# Patient Record
Sex: Male | Born: 1960 | Race: Black or African American | Hispanic: No | Marital: Single | Smoking: Never smoker
Health system: Southern US, Community
[De-identification: ages and names within clinical notes are randomized; demographics above are authoritative.]

## PROBLEM LIST (undated history)

## (undated) DIAGNOSIS — E119 Type 2 diabetes mellitus without complications: Secondary | ICD-10-CM

## (undated) DIAGNOSIS — E785 Hyperlipidemia, unspecified: Secondary | ICD-10-CM

## (undated) DIAGNOSIS — I10 Essential (primary) hypertension: Secondary | ICD-10-CM

## (undated) DIAGNOSIS — G4730 Sleep apnea, unspecified: Secondary | ICD-10-CM

## (undated) HISTORY — PX: MANDIBLE FRACTURE SURGERY: SHX706

---

## 1981-06-18 HISTORY — PX: MANDIBLE FRACTURE SURGERY: SHX706

## 2011-07-24 ENCOUNTER — Ambulatory Visit: Payer: Self-pay | Admitting: Family Medicine

## 2012-05-04 IMAGING — US US RENAL KIDNEY
1 series · 17 of 25 positions shown · non-contrast
Comparison: none

REASON FOR EXAM: hypertension
COMMENTS:

[Series 1: us renal kidney · 17 of 40 slices shown]
[im 1/40]
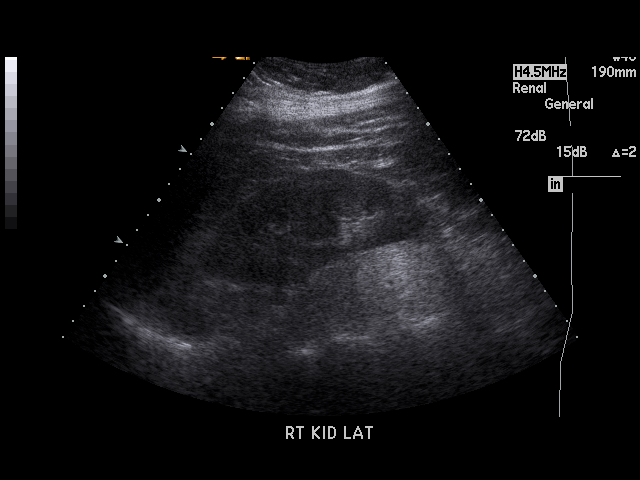
[im 4/40]
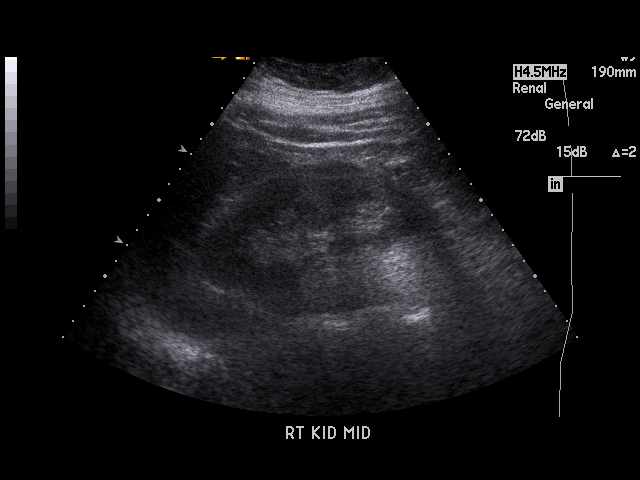
[im 5/40]
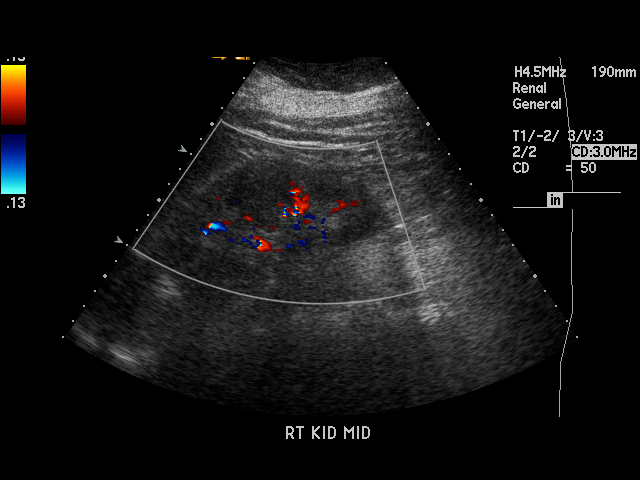
[im 9/40]
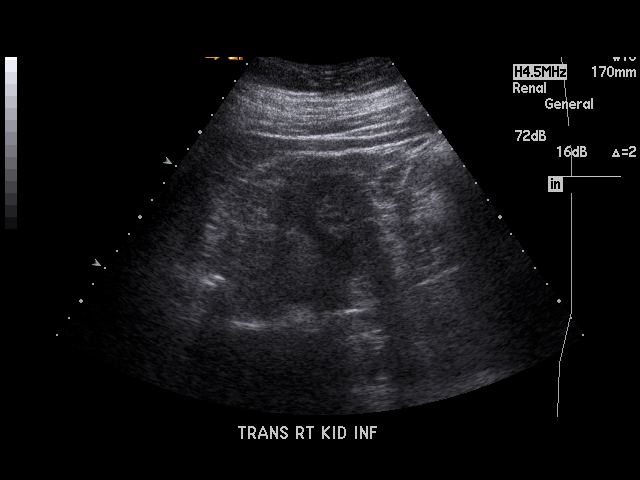
[im 10/40]
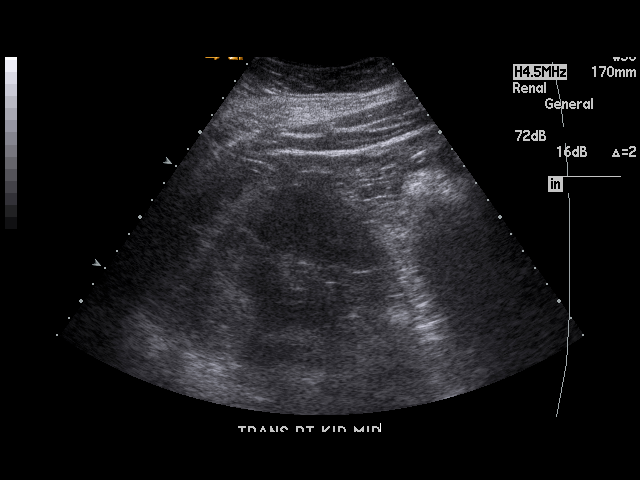
[im 14/40]
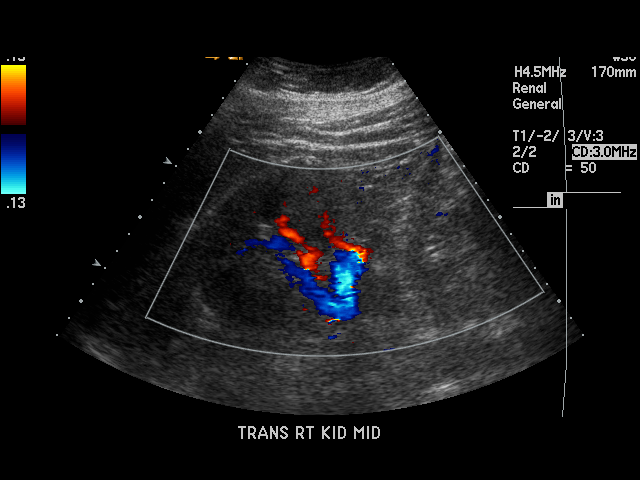
[im 15/40]
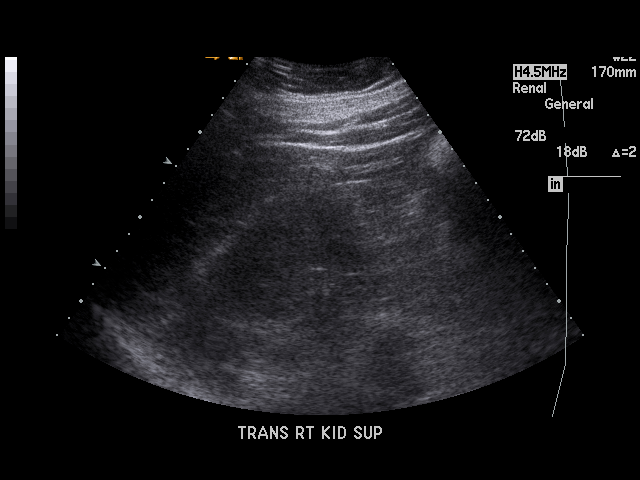
[im 18/40]
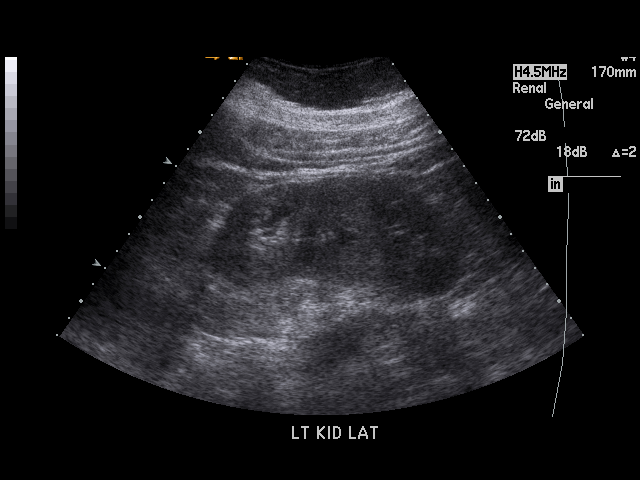
[im 20/40]
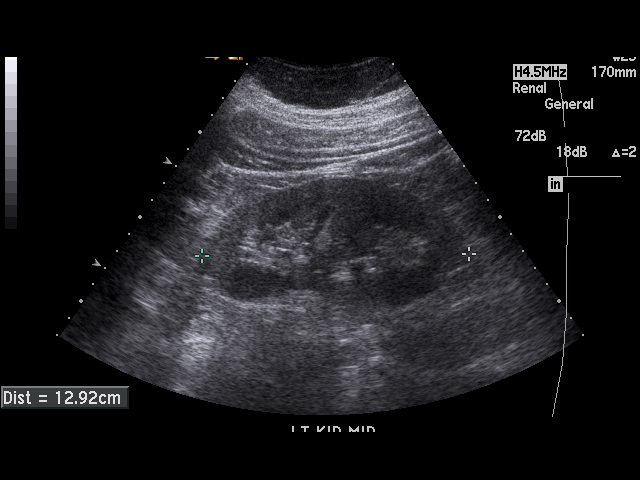
[im 22/40]
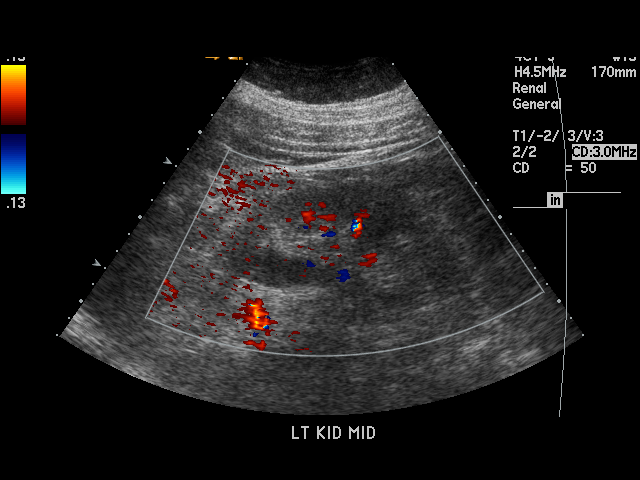
[im 25/40]
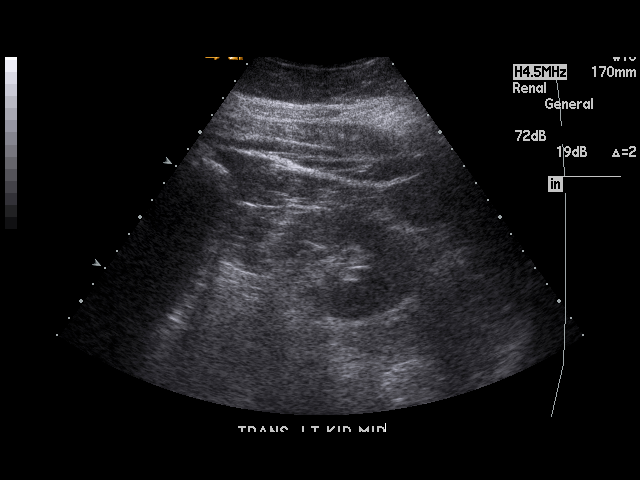
[im 27/40]
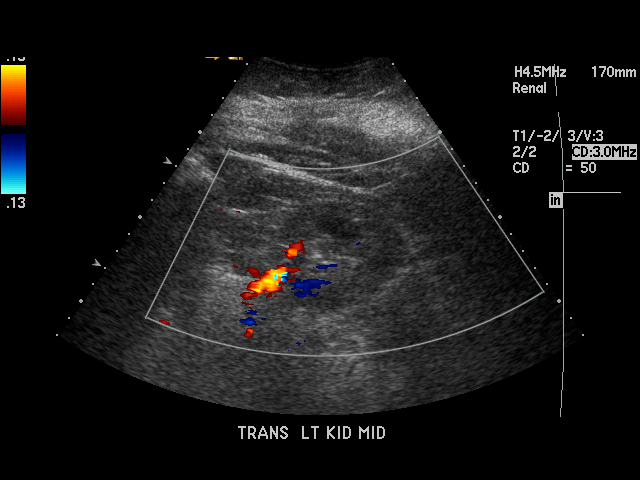
[im 30/40]
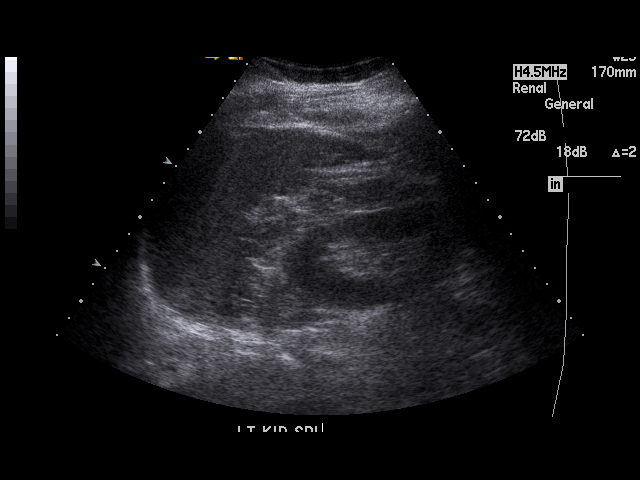
[im 31/40]
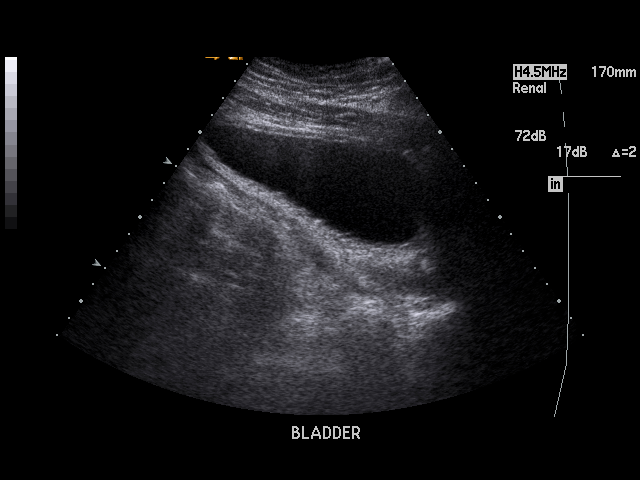
[im 35/40]
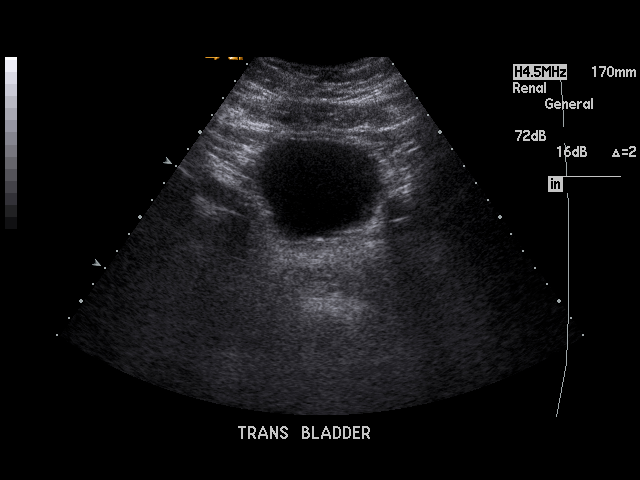
[im 36/40]
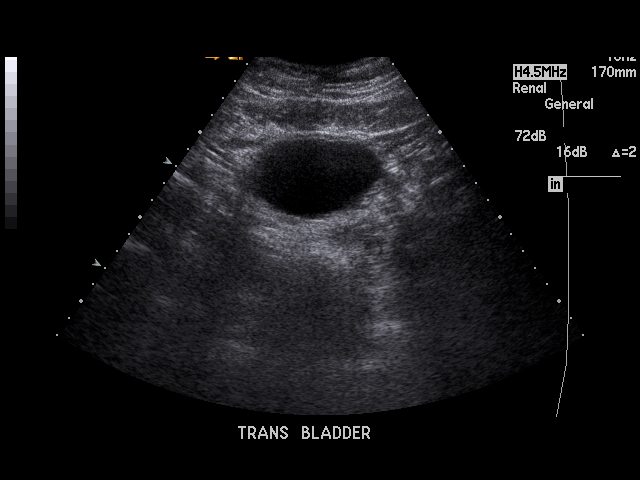
[im 40/40]
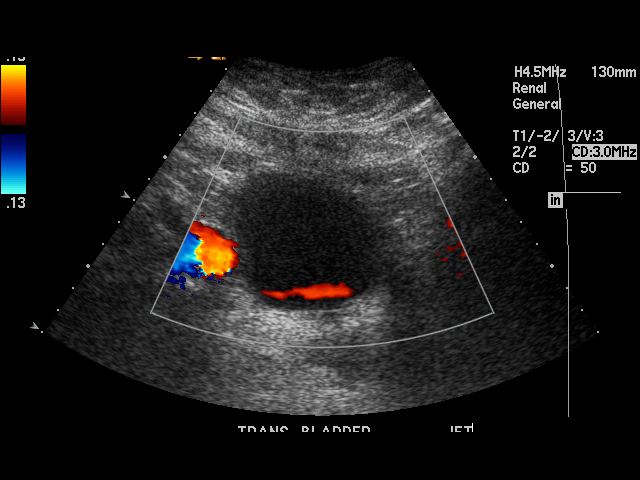

[17 of 25 positions shown; findings below may reference images not displayed]

PROCEDURE:     US  - US KIDNEY  - July 24, 2011  [DATE]

RESULT:     The right kidney measures 12.8 x 7 x 7.2 cm. The echotexture of
the renal cortex remains lower than that of the adjacent liver. The left
kidney measures 12.9 x 6 x 6.1 cm and the cortex also appears normal in
echotexture. Neither kidney exhibits evidence of calcified stones nor of
obstruction. Ureteral jets were demonstrated within the normal-appearing
urinary bladder.
IMPRESSION: Normal renal ultrasound examination.

## 2015-10-27 ENCOUNTER — Encounter: Payer: Self-pay | Admitting: *Deleted

## 2015-10-28 ENCOUNTER — Ambulatory Visit
Admission: RE | Admit: 2015-10-28 | Discharge: 2015-10-28 | Disposition: A | Discharge status: Home / Self Care | Payer: Managed Care, Other (non HMO) | Source: Ambulatory Visit | Attending: Gastroenterology | Admitting: Gastroenterology

## 2015-10-28 ENCOUNTER — Encounter
Admission: RE | Disposition: A | Discharge status: Home / Self Care | Payer: Self-pay | Source: Ambulatory Visit | Attending: Gastroenterology

## 2015-10-28 ENCOUNTER — Ambulatory Visit: Payer: Managed Care, Other (non HMO) | Admitting: Anesthesiology

## 2015-10-28 ENCOUNTER — Encounter: Payer: Self-pay | Admitting: *Deleted

## 2015-10-28 DIAGNOSIS — Z79899 Other long term (current) drug therapy: Secondary | ICD-10-CM | POA: Insufficient documentation

## 2015-10-28 DIAGNOSIS — E669 Obesity, unspecified: Secondary | ICD-10-CM | POA: Insufficient documentation

## 2015-10-28 DIAGNOSIS — Z6841 Body Mass Index (BMI) 40.0 and over, adult: Secondary | ICD-10-CM | POA: Insufficient documentation

## 2015-10-28 DIAGNOSIS — Z1211 Encounter for screening for malignant neoplasm of colon: Secondary | ICD-10-CM | POA: Insufficient documentation

## 2015-10-28 DIAGNOSIS — G4730 Sleep apnea, unspecified: Secondary | ICD-10-CM | POA: Diagnosis not present

## 2015-10-28 DIAGNOSIS — E119 Type 2 diabetes mellitus without complications: Secondary | ICD-10-CM | POA: Diagnosis not present

## 2015-10-28 DIAGNOSIS — I10 Essential (primary) hypertension: Secondary | ICD-10-CM | POA: Insufficient documentation

## 2015-10-28 DIAGNOSIS — E785 Hyperlipidemia, unspecified: Secondary | ICD-10-CM | POA: Diagnosis not present

## 2015-10-28 HISTORY — DX: Essential (primary) hypertension: I10

## 2015-10-28 HISTORY — DX: Hyperlipidemia, unspecified: E78.5

## 2015-10-28 HISTORY — DX: Sleep apnea, unspecified: G47.30

## 2015-10-28 HISTORY — PX: COLONOSCOPY WITH PROPOFOL: SHX5780

## 2015-10-28 HISTORY — DX: Type 2 diabetes mellitus without complications: E11.9

## 2015-10-28 LAB — GLUCOSE, CAPILLARY: Glucose-Capillary: 106 mg/dL — ABNORMAL HIGH (ref 65–99)

## 2015-10-28 SURGERY — COLONOSCOPY WITH PROPOFOL
Anesthesia: General

## 2015-10-28 MED ORDER — MIDAZOLAM HCL 2 MG/2ML IJ SOLN
INTRAMUSCULAR | Status: DC | PRN
  Administered 2015-10-28: 2 mg via INTRAVENOUS

## 2015-10-28 MED ORDER — SODIUM CHLORIDE 0.9 % IV SOLN: INTRAVENOUS | Status: DC

## 2015-10-28 MED ORDER — PROPOFOL 500 MG/50ML IV EMUL
INTRAVENOUS | Status: DC | PRN
  Administered 2015-10-28: 150 ug/kg/min via INTRAVENOUS

## 2015-10-28 MED ORDER — SODIUM CHLORIDE 0.9 % IV SOLN
INTRAVENOUS | Status: DC
  Administered 2015-10-28: 08:00:00 via INTRAVENOUS

## 2015-10-28 MED ORDER — LIDOCAINE HCL (CARDIAC) 20 MG/ML IV SOLN
INTRAVENOUS | Status: DC | PRN
  Administered 2015-10-28: 80 mg via INTRAVENOUS

## 2015-10-28 NOTE — Anesthesia Preprocedure Evaluation (Signed)
Anesthesia Evaluation  Patient identified by MRN, date of birth, ID band Patient awake    Reviewed: Allergy & Precautions, H&P , NPO status , Patient's Chart, lab work & pertinent test results, reviewed documented beta blocker date and time   History of Anesthesia Complications Negative for: history of anesthetic complications  Airway Mallampati: IV  TM Distance: >3 FB Neck ROM: full    Dental no notable dental hx. (+) Edentulous Upper, Upper Dentures   Pulmonary neg shortness of breath, sleep apnea , neg COPD, neg recent URI,    Pulmonary exam normal breath sounds clear to auscultation       Cardiovascular Exercise Tolerance: Good hypertension, On Medications (-) angina(-) CAD, (-) Past MI, (-) Cardiac Stents and (-) CABG Normal cardiovascular exam(-) dysrhythmias (-) Valvular Problems/Murmurs Rhythm:regular Rate:Normal     Neuro/Psych negative neurological ROS  negative psych ROS   GI/Hepatic negative GI ROS, Neg liver ROS,   Endo/Other  diabetesMorbid obesity  Renal/GU negative Renal ROS  negative genitourinary   Musculoskeletal   Abdominal   Peds  Hematology negative hematology ROS (+)   Anesthesia Other Findings Past Medical History:   Hypertension                                                 Diabetes mellitus without complication (HCC)                 Hyperlipidemia                                               Sleep apnea                                                  Reproductive/Obstetrics negative OB ROS                             Anesthesia Physical Anesthesia Plan  ASA: III  Anesthesia Plan: General   Post-op Pain Management:    Induction:   Airway Management Planned:   Additional Equipment:   Intra-op Plan:   Post-operative Plan:   Informed Consent: I have reviewed the patients History and Physical, chart, labs and discussed the procedure including the  risks, benefits and alternatives for the proposed anesthesia with the patient or authorized representative who has indicated his/her understanding and acceptance.   Dental Advisory Given  Plan Discussed with: Anesthesiologist, CRNA and Surgeon  Anesthesia Plan Comments:         Anesthesia Quick Evaluation

## 2015-10-28 NOTE — Transfer of Care (Signed)
Immediate Anesthesia Transfer of Care Note  Patient: Lucas Hall  Procedure(s) Performed: Procedure(s): COLONOSCOPY WITH PROPOFOL (N/A)  Patient Location: PACU and Endoscopy Unit  Anesthesia Type:General  Level of Consciousness: sedated  Airway & Oxygen Therapy: Patient Spontanous Breathing and Patient connected to nasal cannula oxygen  Post-op Assessment: Report given to RN and Post -op Vital signs reviewed and stable  Post vital signs: Reviewed and stable  Last Vitals: 0833 - 62 hr 18 resp 97.3 121/60 bp  Filed Vitals:   10/28/15 0717  BP: 192/104  Pulse: 50  Temp: 35.9 C  Resp: 18    Last Pain: There were no vitals filed for this visit.       Complications: No apparent anesthesia complications

## 2015-10-28 NOTE — Anesthesia Postprocedure Evaluation (Signed)
Anesthesia Post Note  Patient: Lucas Hall  Procedure(s) Performed: Procedure(s) (LRB): COLONOSCOPY WITH PROPOFOL (N/A)  Patient location during evaluation: Endoscopy Anesthesia Type: General Level of consciousness: awake and alert Pain management: pain level controlled Vital Signs Assessment: post-procedure vital signs reviewed and stable Respiratory status: spontaneous breathing, nonlabored ventilation, respiratory function stable and patient connected to nasal cannula oxygen Cardiovascular status: blood pressure returned to baseline and stable Postop Assessment: no signs of nausea or vomiting Anesthetic complications: no    Last Vitals:  Filed Vitals:   10/28/15 0840 10/28/15 0850  BP: 115/65 122/79  Pulse: 56 60  Temp:    Resp: 18 20    Last Pain: There were no vitals filed for this visit.               Lenard SimmerAndrew Emagene Merfeld
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
## 2015-10-28 NOTE — Op Note (Signed)
[facility] Patient Name: Lucas Hall Procedure Date: 10/28/2015 8:10 AM MRN: [id number] Account #: [id number] Date of Birth: [birth year] Admit Type: Outpatient Age: 55 Room: [facility] ENDO ROOM 4 Gender: Male Note Status: Finalized Procedure:            Colonoscopy Indications:          Screening for colorectal malignant neoplasm Providers:            Ezzard Standing. Bluford Kaufmann, MD Referring MD:         Marina Goodell (Referring MD) Medicines:            Monitored Anesthesia Care Complications:        No immediate complications. Procedure:            Pre-Anesthesia Assessment:                       - Prior to the procedure, a History and Physical was                        performed, and patient medications, allergies and                        sensitivities were reviewed. The patient's tolerance of                        previous anesthesia was reviewed.                       - The risks and benefits of the procedure and the                        sedation options and risks were discussed with the                        patient. All questions were answered and informed                        consent was obtained.                       - After reviewing the risks and benefits, the patient                        was deemed in satisfactory condition to undergo the                        procedure.                       After obtaining informed consent, the colonoscope was                        passed under direct vision. Throughout the procedure,                        the patient's blood pressure, pulse, and oxygen                        saturations were monitored continuously. The  Colonoscope was introduced through the anus and                        advanced to the the ascending colon. The colonoscopy                        was performed with difficulty due to significant                        looping. The patient tolerated the procedure  well. The                        quality of the bowel preparation was good. Findings:      The colon (entire examined portion) appeared normal. Unable to reach       ceecum despite change in position and abdominal pressure. Impression:           - The entire examined colon is normal.                       - No specimens collected. Recommendation:       - Discharge patient to home.                       - Repeat colonoscopy in 10 years for surveillance.                       - The findings and recommendations were discussed with                        the patient. Procedure Code(s):    --- Professional ---                       [phone], 53, Colonoscopy, flexible; diagnostic, including                        collection of specimen(s) by brushing or washing, when                        performed (separate procedure) Diagnosis Code(s):    --- Professional ---                       Z12.11, Encounter for screening for malignant neoplasm                        of colon CPT copyright 2016 [employer]. All rights reserved. The codes documented in this report are preliminary and upon coder review may  be revised to meet current compliance requirements. Wallace CullensPaul Y Starlett Pehrson, MD 10/28/2015 8:29:34 AM This report has been signed electronically. Number of Addenda: 0 Note Initiated On: 10/28/2015 8:10 AM Scope Withdrawal Time: 0 hours 2 minutes 32 seconds  Total Procedure Duration: 0 hours 10 minutes 48 seconds       [facility]

## 2015-10-31 ENCOUNTER — Encounter: Payer: Self-pay | Admitting: Gastroenterology

## 2017-12-11 ENCOUNTER — Ambulatory Visit: Payer: Managed Care, Other (non HMO) | Admitting: *Deleted

## 2018-12-15 ENCOUNTER — Other Ambulatory Visit: Payer: Self-pay | Admitting: Family Medicine

## 2018-12-15 DIAGNOSIS — R600 Localized edema: Secondary | ICD-10-CM

## 2018-12-18 ENCOUNTER — Other Ambulatory Visit: Payer: Self-pay

## 2018-12-18 ENCOUNTER — Ambulatory Visit
Admission: RE | Admit: 2018-12-18 | Discharge: 2018-12-18 | Disposition: A | Discharge status: Home / Self Care | Payer: BC Managed Care – PPO | Source: Ambulatory Visit | Attending: Family Medicine | Admitting: Family Medicine

## 2018-12-18 ENCOUNTER — Encounter (INDEPENDENT_AMBULATORY_CARE_PROVIDER_SITE_OTHER): Payer: Self-pay

## 2018-12-18 DIAGNOSIS — R600 Localized edema: Secondary | ICD-10-CM | POA: Diagnosis present

## 2019-09-29 IMAGING — US RIGHT LOWER EXTREMITY VENOUS ULTRASOUND
1 series · 13 of 24 positions shown · non-contrast
Comparison: None.

CLINICAL DATA: Right lower extremity edema.  Evaluate for DVT.



[Series 1: right lower extremity venous ultrasound · 0.10mm/px · 13 of 32 slices shown]
[im 1/32]
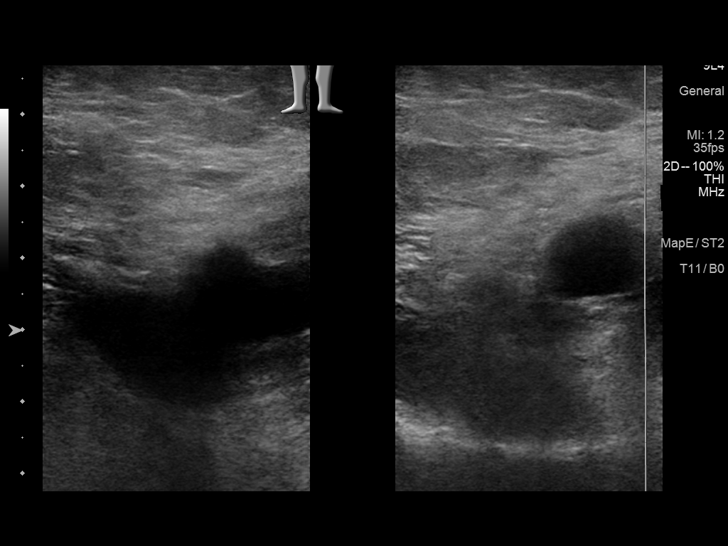
[im 3/32]
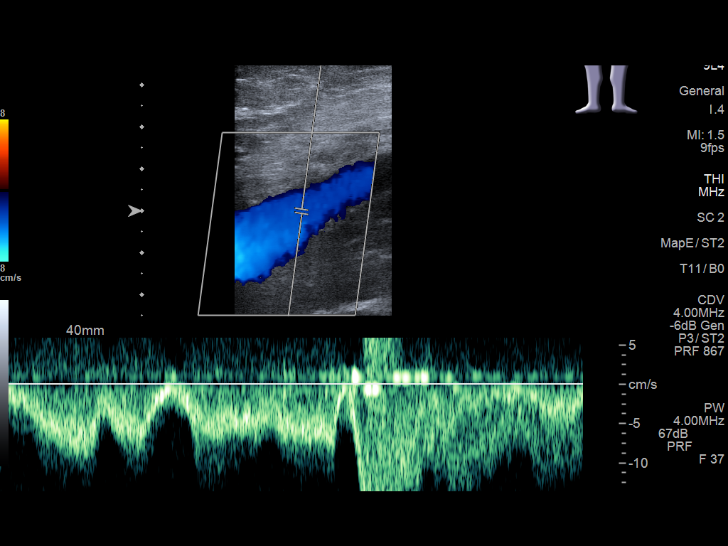
[im 6/32]
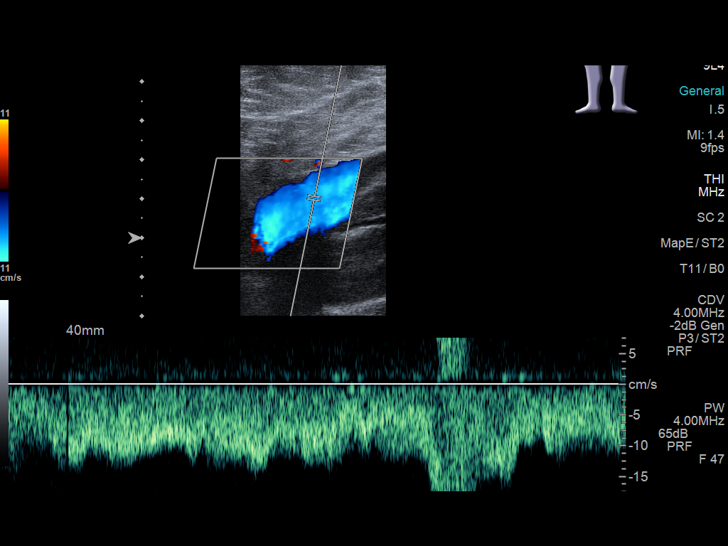
[im 9/32]
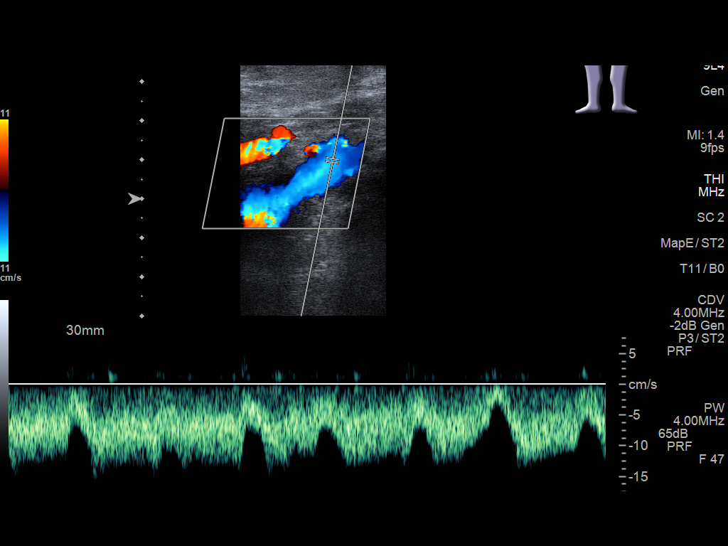
[im 11/32]
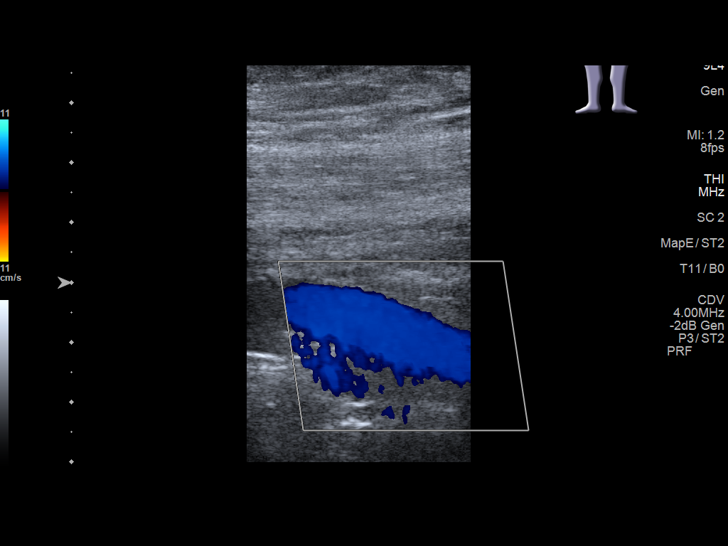
[im 14/32]
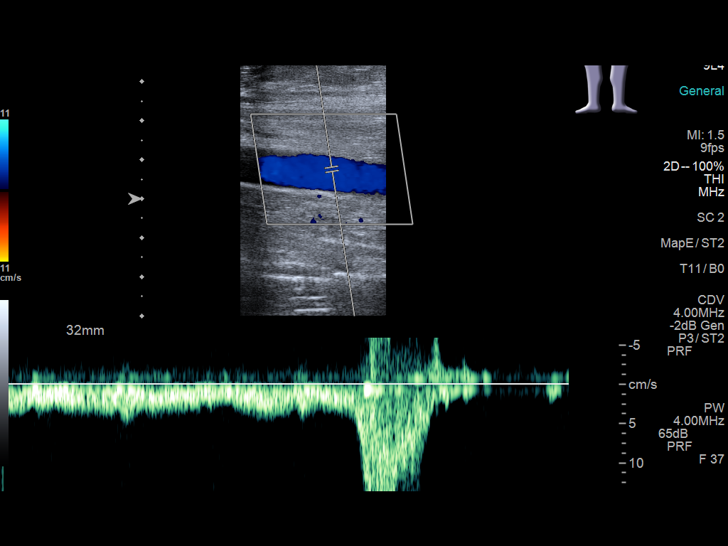
[im 17/32]
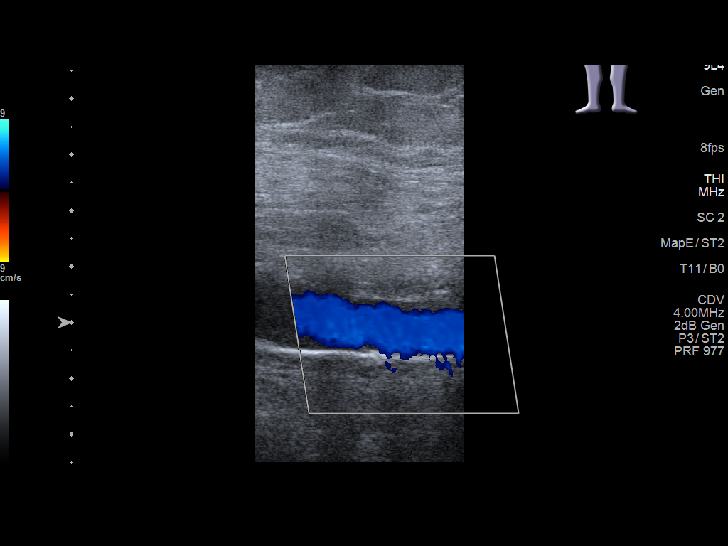
[im 18/32]
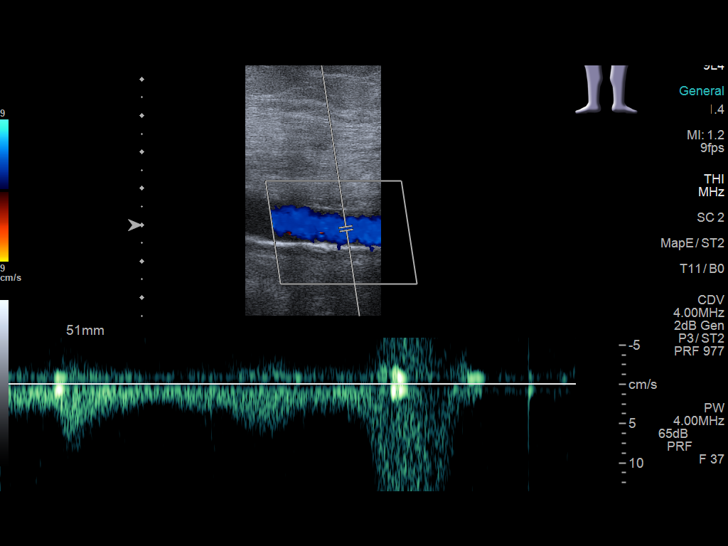
[im 21/32]
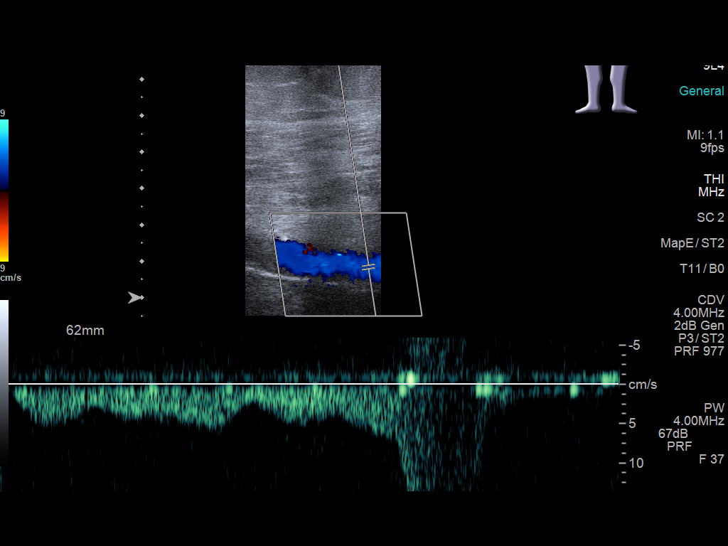
[im 23/32]
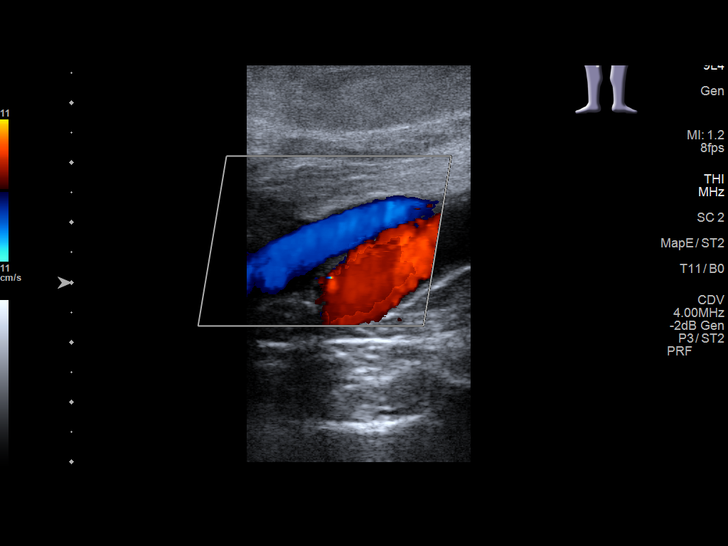
[im 26/32]
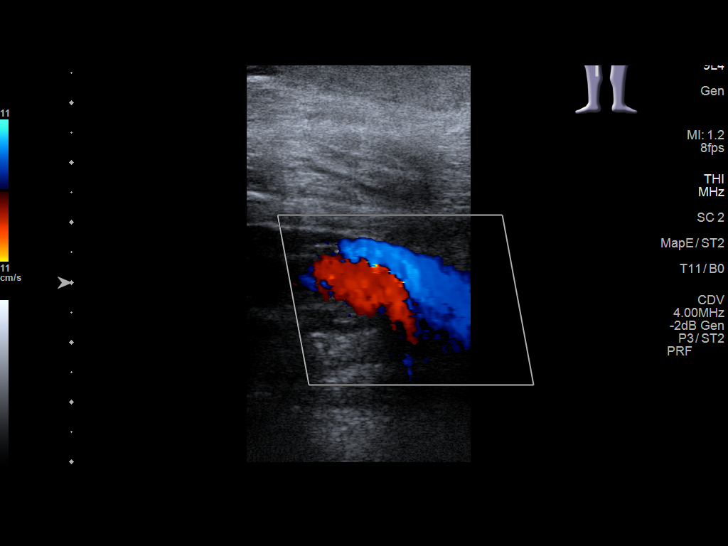
[im 29/32]
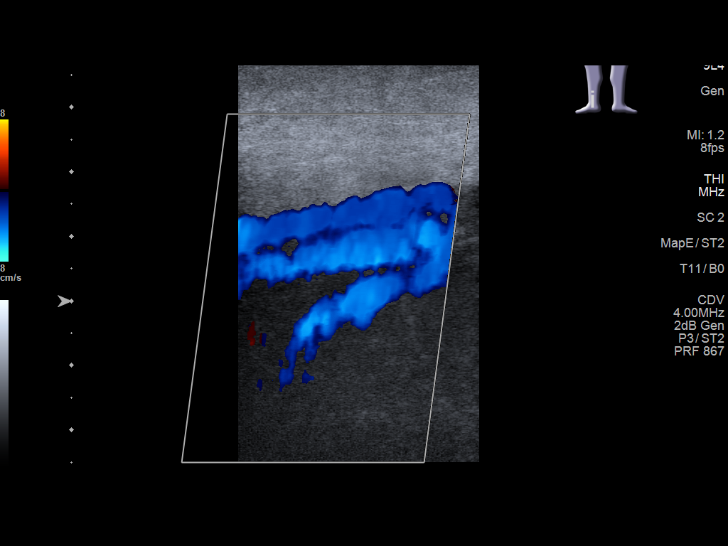
[im 32/32]
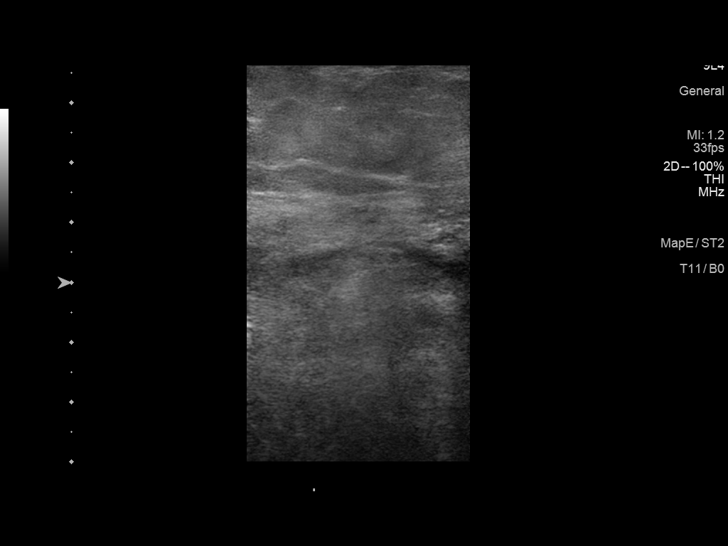

[13 of 24 positions shown; findings below may reference images not displayed]

FINDINGS: Contralateral Common Femoral Vein: Respiratory phasicity is normal
and symmetric with the symptomatic side. No evidence of thrombus.
Normal compressibility.

Common Femoral Vein: No evidence of thrombus. Normal
compressibility, respiratory phasicity and response to augmentation.

Saphenofemoral Junction: No evidence of thrombus. Normal
compressibility and flow on color Doppler imaging.

Profunda Femoral Vein: No evidence of thrombus. Normal
compressibility and flow on color Doppler imaging.

Femoral Vein: No evidence of thrombus. Normal compressibility,
respiratory phasicity and response to augmentation.

Popliteal Vein: No evidence of thrombus. Normal compressibility,
respiratory phasicity and response to augmentation.

Calf Veins: No evidence of thrombus. Normal compressibility and flow
on color Doppler imaging.

Superficial Great Saphenous Vein: No evidence of thrombus. Normal
compressibility.

Venous Reflux:  None.

Other Findings: No fluid collection is seen within the popliteal
fossa to suggest the presence of a Baker cyst.
IMPRESSION: No evidence of DVT within the right lower extremity.

## 2021-12-08 ENCOUNTER — Other Ambulatory Visit: Payer: Self-pay | Admitting: General Surgery

## 2021-12-08 DIAGNOSIS — K439 Ventral hernia without obstruction or gangrene: Secondary | ICD-10-CM

## 2021-12-22 ENCOUNTER — Ambulatory Visit
Admission: RE | Admit: 2021-12-22 | Discharge: 2021-12-22 | Disposition: A | Discharge status: Home / Self Care | Payer: BC Managed Care – PPO | Source: Ambulatory Visit | Attending: General Surgery | Admitting: General Surgery

## 2021-12-22 DIAGNOSIS — K439 Ventral hernia without obstruction or gangrene: Secondary | ICD-10-CM | POA: Insufficient documentation

## 2021-12-22 MED ORDER — IOHEXOL 300 MG/ML  SOLN
100.0000 mL | Freq: Once | INTRAMUSCULAR | Status: AC | PRN
  Administered 2021-12-22: 100 mL via INTRAVENOUS

## 2022-08-13 ENCOUNTER — Ambulatory Visit: Payer: Self-pay | Admitting: General Surgery
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
## 2022-08-21 ENCOUNTER — Encounter
Admission: RE | Admit: 2022-08-21 | Discharge: 2022-08-21 | Disposition: A | Discharge status: Home / Self Care | Payer: BC Managed Care – PPO | Source: Ambulatory Visit | Attending: General Surgery | Admitting: General Surgery

## 2022-08-21 ENCOUNTER — Other Ambulatory Visit: Payer: Self-pay

## 2022-08-21 VITALS — Ht 71.0 in | Wt 255.0 lb

## 2022-08-21 DIAGNOSIS — I10 Essential (primary) hypertension: Secondary | ICD-10-CM

## 2022-08-21 DIAGNOSIS — E119 Type 2 diabetes mellitus without complications: Secondary | ICD-10-CM

## 2022-08-21 NOTE — Patient Instructions (Addendum)
Your procedure is scheduled on: Wednesday August 29, 2022. Report to the Registration Desk on the 1st floor of the [facility]. To find out your arrival time, please call [phone] between 1PM - 3PM on: Tuesday August 28, 2022. If your arrival time is 6:00 am, do not arrive before that time as the [facility] entrance doors do not open until 6:00 am.  REMEMBER: Instructions that are not followed completely may result in serious medical risk, up to and including death; or upon the discretion of your surgeon and anesthesiologist your surgery may need to be rescheduled.  Do not eat food or drink fluids after midnight the night before surgery.  No gum chewing or hard candies.   One week prior to surgery: Stop Anti-inflammatories (NSAIDS) such as Advil, Aleve, Ibuprofen, Motrin, Naproxen, Naprosyn and Aspirin based products such as Excedrin, Goody's Powder, BC Powder. Stop ANY OVER THE COUNTER supplements until after surgery. You may however, continue to take Tylenol if needed for pain up until the day of surgery.  Continue taking all prescribed medications with the exception of the following: Stop metFORMIN (GLUCOPHAGE) 500 MG 2 days prior to surgery (take last dose Sunday 08/26/22) Stop Semaglutide (OZEMPIC, 2 MG/DOSE, [place]) 1 week prior to surgery (do not take anymore this medication until after your surgery.    TAKE ONLY THESE MEDICATIONS THE MORNING OF SURGERY WITH A SIP OF WATER:  amLODipine (NORVASC) 10 MG  atorvastatin (LIPITOR) 10 MG  metoprolol (LOPRESSOR) 100 Mg    No Alcohol for 24 hours before or after surgery.  No Smoking including e-cigarettes for 24 hours before surgery.  No chewable tobacco products for at least 6 hours before surgery.  No nicotine patches on the day of surgery.  Do not use any "recreational" drugs for at least a week (preferably 2 weeks) before your surgery.  Please be advised that the combination of cocaine and anesthesia may have negative  outcomes, up to and including death. If you test positive for cocaine, your surgery will be cancelled.  On the morning of surgery brush your teeth with toothpaste and water, you may rinse your mouth with mouthwash if you wish. Do not swallow any toothpaste or mouthwash.  Use CHG Soap or wipes as directed on instruction sheet.  Do not wear jewelry, make-up, hairpins, clips or nail polish.  Do not wear lotions, powders, or perfumes.   Do not shave body hair from the neck down 48 hours before surgery.  Contact lenses, hearing aids and dentures may not be worn into surgery.  Do not bring valuables to the hospital. [facility] is not responsible for any missing/lost belongings or valuables.   Notify your doctor if there is any change in your medical condition (cold, fever, infection).  Wear comfortable clothing (specific to your surgery type) to the hospital.  After surgery, you can help prevent lung complications by doing breathing exercises.  Take deep breaths and cough every 1-2 hours. Your doctor may order a device called an Incentive Spirometer to help you take deep breaths. When coughing or sneezing, hold a pillow firmly against your incision with both hands. This is called "splinting." Doing this helps protect your incision. It also decreases belly discomfort.  If you are being admitted to the hospital overnight, leave your suitcase in the car. After surgery it may be brought to your room.  In case of increased patient census, it may be necessary for you, the patient, to continue your postoperative care in the Same  Day Surgery department.  If you are being discharged the day of surgery, you will not be allowed to drive home. You will need a responsible individual to drive you home and stay with you for 24 hours after surgery.   If you are taking public transportation, you will need to have a responsible individual with you.  Please call the [facility]. at [phone] if you have any questions about these instructions.  Surgery Visitation Policy:  Patients undergoing a surgery or procedure may have two family members or support persons with them as long as the person is not COVID-19 positive or experiencing its symptoms.   Inpatient Visitation:    Visiting hours are 7 a.m. to 8 p.m. Up to four visitors are allowed at one time in a patient room. The visitors may rotate out with other people during the day. One designated support person (adult) may remain overnight.  Due to an increase in RSV and influenza rates and associated hospitalizations, children ages 51 and under will not be able to visit patients in [facility]. Masks continue to be strongly recommended.     Preparing for Surgery with CHLORHEXIDINE GLUCONATE (CHG) Soap  Chlorhexidine Gluconate (CHG) Soap  o An antiseptic cleaner that kills germs and bonds with the skin to continue killing germs even after washing  o Used for showering the night before surgery and morning of surgery  Before surgery, you can play an important role by reducing the number of germs on your skin.  CHG (Chlorhexidine gluconate) soap is an antiseptic cleanser which kills germs and bonds with the skin to continue killing germs even after washing.  Please do not use if you have an allergy to CHG or antibacterial soaps. If your skin becomes reddened/irritated stop using the CHG.  1. Shower the NIGHT BEFORE SURGERY and the MORNING OF SURGERY with CHG soap.  2. If you choose to wash your hair, wash your hair first as usual with your normal shampoo.  3. After shampooing, rinse your hair and body thoroughly to remove the shampoo.  4. Use CHG as you would any other liquid soap. You can apply CHG directly to the skin and wash gently with a scrungie or a clean washcloth.  5. Apply the CHG soap to your body only from the neck down. Do not use on open wounds or open sores. Avoid contact with your eyes,  ears, mouth, and genitals (private parts). Wash face and genitals (private parts) with your normal soap.  6. Wash thoroughly, paying special attention to the area where your surgery will be performed.  7. Thoroughly rinse your body with warm water.  8. Do not shower/wash with your normal soap after using and rinsing off the CHG soap.  9. Pat yourself dry with a clean towel.  10. Wear clean pajamas to bed the night before surgery.  12. Place clean sheets on your bed the night of your first shower and do not sleep with pets.  13. Shower again with the CHG soap on the day of surgery prior to arriving at the hospital.  14. Do not apply any deodorants/lotions/powders.  15. Please wear clean clothes to the hospital.

## 2022-08-22 ENCOUNTER — Encounter
Admission: RE | Admit: 2022-08-22 | Discharge: 2022-08-22 | Disposition: A | Discharge status: Home / Self Care | Payer: BC Managed Care – PPO | Source: Ambulatory Visit | Attending: General Surgery | Admitting: General Surgery

## 2022-08-22 DIAGNOSIS — Z01818 Encounter for other preprocedural examination: Secondary | ICD-10-CM | POA: Diagnosis present

## 2022-08-22 DIAGNOSIS — I10 Essential (primary) hypertension: Secondary | ICD-10-CM | POA: Insufficient documentation

## 2022-08-22 DIAGNOSIS — E119 Type 2 diabetes mellitus without complications: Secondary | ICD-10-CM | POA: Diagnosis not present

## 2022-08-22 LAB — BASIC METABOLIC PANEL
Anion gap: 9 (ref 5–15)
BUN: 22 mg/dL (ref 8–23)
CO2: 26 mmol/L (ref 22–32)
Calcium: 9.3 mg/dL (ref 8.9–10.3)
Chloride: 104 mmol/L (ref 98–111)
Creatinine, Ser: 1.16 mg/dL (ref 0.61–1.24)
GFR, Estimated: >60 mL/min (ref >60)
Glucose, Bld: 104 mg/dL — ABNORMAL HIGH (ref 70–99)
Potassium: 3.5 mmol/L (ref 3.5–5.1)
Sodium: 139 mmol/L (ref 135–145)

## 2022-08-22 LAB — CBC
HCT: 39.5 % (ref 39.0–52.0)
Hemoglobin: 13.4 g/dL (ref 13.0–17.0)
MCH: 29.8 pg (ref 26.0–34.0)
MCHC: 33.9 g/dL (ref 30.0–36.0)
MCV: 88 fL (ref 80.0–100.0)
Platelets: 161 10*3/uL (ref 150–400)
RBC: 4.49 MIL/uL (ref 4.22–5.81)
RDW: 12.5 % (ref 11.5–15.5)
WBC: 3.8 10*3/uL — ABNORMAL LOW (ref 4.0–10.5)
nRBC: 0 % (ref 0.0–0.2)

## 2022-08-23 NOTE — Progress Notes (Signed)
  Perioperative Services Pre-Admission/Anesthesia Testing    Date: 08/23/22  Name: Lucas Hall MRN:   [id number]  Re: GLP-1 clearance and provider recommendations   Planned Surgical Procedure(s):    Case: [id number] Date/Time: 08/29/22 0955   Procedure: HERNIA REPAIR VENTRAL ADULT (Abdomen) - please extend case for 180 minutes   Anesthesia type: General   Pre-op diagnosis: K43.9 ventral hernia w/o obstruction or gangrene   Location: [facility] OR ROOM 07 / [facility]   Surgeons: Herbert Pun, MD      Clinical Notes:  Patient is scheduled for the above procedure with the indicated provider/surgeon. In review of his medication reconciliation it was noted that patient is on a prescribed GLP-1 medication. Per guidelines issued by the American Society of Anesthesiologists (ASA), it is recommended that these medications be held for 7 days prior to the patient undergoing any type of elective surgical procedure. The patient is taking the following GLP-1 medication:  '[x]'$  SEMAGLUTIDE   '[]'$  EXENATIDE  '[]'$  LIRAGLUTIDE   '[]'$  LIXISENATIDE  '[]'$  DULAGLUTIDE     '[]'$  OTHER GLP-1 medication: _______________  Reached out to prescribing provider Honor Junes, MD) to make them aware of the guidelines from anesthesia. Given that this patient takes the prescribed GLP-1 medication for his  diabetes diagnosis, rather than for weight loss, recommendations from the prescribing provider were solicited. Prescribing provider made aware of the following so that informed decision/POC can be developed for this patient that may be taking medications belonging to these drug classes:  Oral GLP-1 medications will be held 1 day prior to surgery.  Injectable GLP-1 medications will be held 7 days prior to surgery.  Metformin is routinely held 48 hours prior to surgery due to renal concerns, potential need for contrasted imaging perioperatively, and the potential for tissue hypoxia leading to drug induced  lactic acidosis.  All SGLT2i medications are held 72 hours prior to surgery as they can be associated with the increased potential for developing euglycemic diabetic ketoacidosis (EDKA).   Impression and Plan:  Lucas Hall is on a prescribed GLP-1 medication, which induces the known side effect of decreased gastric emptying. Efforts are bring made to mitigate the risk of perioperative hyperglycemic events, as elevated blood glucose levels have been found to contribute to intra/postoperative complications. Additionally, hyperglycemic extremes can potentially necessitate the postponing of a patient's elective case in order to better optimize perioperative glycemic control, again with the aforementioned guidelines in place. With this in mind, recommendations have been sought from the prescribing provider, who has cleared patient to proceed with holding the prescribed GLP-1 as per the guidelines from the ASA.   Provider recommending: no further recommendations received from the prescribing provider.  Copy of signed clearance and recommendations placed on patient's chart for inclusion in their medical record and for review by the surgical/anesthetic team on the day of his procedure.   Honor Loh, MSN, APRN, FNP-C, CEN [facility]  Peri-operative Services Nurse Practitioner Phone: [phone] 08/23/22 9:22 AM  NOTE: This note has been prepared using Dragon dictation software. Despite my best ability to proofread, there is always the potential that unintentional transcriptional errors may still occur from this process.

## 2022-08-28 MED ORDER — CEFAZOLIN SODIUM-DEXTROSE 2-4 GM/100ML-% IV SOLN
2.0000 g | INTRAVENOUS | Status: AC
  Administered 2022-08-29: 2 g via INTRAVENOUS

## 2022-08-28 MED ORDER — FAMOTIDINE 20 MG PO TABS: 20.0000 mg | ORAL_TABLET | Freq: Once | ORAL | Status: AC

## 2022-08-28 MED ORDER — BUPIVACAINE LIPOSOME 1.3 % IJ SUSP: 20.0000 mL | Freq: Once | INTRAMUSCULAR | Status: DC

## 2022-08-28 MED ORDER — SODIUM CHLORIDE 0.9 % IV SOLN: INTRAVENOUS | Status: DC

## 2022-08-28 MED ORDER — GABAPENTIN 300 MG PO CAPS: 300.0000 mg | ORAL_CAPSULE | ORAL | Status: AC

## 2022-08-28 MED ORDER — ORAL CARE MOUTH RINSE: 15.0000 mL | Freq: Once | OROMUCOSAL | Status: AC

## 2022-08-28 MED ORDER — ACETAMINOPHEN 500 MG PO TABS: 1000.0000 mg | ORAL_TABLET | ORAL | Status: AC

## 2022-08-28 MED ORDER — CELECOXIB 200 MG PO CAPS: 200.0000 mg | ORAL_CAPSULE | ORAL | Status: AC

## 2022-08-28 MED ORDER — CHLORHEXIDINE GLUCONATE 0.12 % MT SOLN: 15.0000 mL | Freq: Once | OROMUCOSAL | Status: AC

## 2022-08-29 ENCOUNTER — Inpatient Hospital Stay
Admission: RE | Admit: 2022-08-29 | Discharge: 2022-08-31 | DRG: 355 | Disposition: A | Discharge status: Home / Self Care | Payer: BC Managed Care – PPO | Attending: General Surgery | Admitting: General Surgery

## 2022-08-29 ENCOUNTER — Inpatient Hospital Stay: Payer: BC Managed Care – PPO | Admitting: Urgent Care

## 2022-08-29 ENCOUNTER — Other Ambulatory Visit: Payer: Self-pay

## 2022-08-29 ENCOUNTER — Encounter
Admission: RE | Disposition: A | Discharge status: Home / Self Care | Payer: Self-pay | Source: Home / Self Care | Attending: General Surgery

## 2022-08-29 ENCOUNTER — Encounter: Payer: Self-pay | Admitting: General Surgery

## 2022-08-29 DIAGNOSIS — K660 Peritoneal adhesions (postprocedural) (postinfection): Secondary | ICD-10-CM | POA: Diagnosis present

## 2022-08-29 DIAGNOSIS — E119 Type 2 diabetes mellitus without complications: Secondary | ICD-10-CM | POA: Diagnosis present

## 2022-08-29 DIAGNOSIS — Z8249 Family history of ischemic heart disease and other diseases of the circulatory system: Secondary | ICD-10-CM | POA: Diagnosis not present

## 2022-08-29 DIAGNOSIS — K439 Ventral hernia without obstruction or gangrene: Principal | ICD-10-CM | POA: Diagnosis present

## 2022-08-29 DIAGNOSIS — Z79890 Hormone replacement therapy (postmenopausal): Secondary | ICD-10-CM | POA: Diagnosis not present

## 2022-08-29 DIAGNOSIS — M549 Dorsalgia, unspecified: Secondary | ICD-10-CM | POA: Diagnosis present

## 2022-08-29 DIAGNOSIS — E785 Hyperlipidemia, unspecified: Secondary | ICD-10-CM | POA: Diagnosis present

## 2022-08-29 DIAGNOSIS — Z833 Family history of diabetes mellitus: Secondary | ICD-10-CM | POA: Diagnosis not present

## 2022-08-29 DIAGNOSIS — Z6836 Body mass index (BMI) 36.0-36.9, adult: Secondary | ICD-10-CM | POA: Diagnosis not present

## 2022-08-29 DIAGNOSIS — Z79899 Other long term (current) drug therapy: Secondary | ICD-10-CM | POA: Diagnosis not present

## 2022-08-29 DIAGNOSIS — K436 Other and unspecified ventral hernia with obstruction, without gangrene: Principal | ICD-10-CM | POA: Diagnosis present

## 2022-08-29 DIAGNOSIS — I10 Essential (primary) hypertension: Secondary | ICD-10-CM | POA: Diagnosis present

## 2022-08-29 DIAGNOSIS — E669 Obesity, unspecified: Secondary | ICD-10-CM | POA: Diagnosis present

## 2022-08-29 HISTORY — PX: VENTRAL HERNIA REPAIR: SHX424

## 2022-08-29 LAB — GLUCOSE, CAPILLARY
Glucose-Capillary: 85 mg/dL (ref 70–99)
Glucose-Capillary: 172 mg/dL — ABNORMAL HIGH (ref 70–99)
Glucose-Capillary: 160 mg/dL — ABNORMAL HIGH (ref 70–99)
Glucose-Capillary: 182 mg/dL — ABNORMAL HIGH (ref 70–99)
Glucose-Capillary: 126 mg/dL — ABNORMAL HIGH (ref 70–99)

## 2022-08-29 SURGERY — REPAIR, HERNIA, VENTRAL
Anesthesia: General | Site: Abdomen

## 2022-08-29 MED ORDER — SODIUM CHLORIDE 0.9 % IV SOLN: INTRAVENOUS | Status: DC

## 2022-08-29 MED ORDER — SODIUM CHLORIDE (PF) 0.9 % IJ SOLN
INTRAMUSCULAR | Status: DC | PRN
  Administered 2022-08-29: 100 mL

## 2022-08-29 MED ORDER — CELECOXIB 200 MG PO CAPS
ORAL_CAPSULE | ORAL | Status: AC
  Filled 2022-08-29: qty 1

## 2022-08-29 MED ORDER — SUGAMMADEX SODIUM 200 MG/2ML IV SOLN
INTRAVENOUS | Status: DC | PRN
  Administered 2022-08-29: 200 mg via INTRAVENOUS

## 2022-08-29 MED ORDER — METOPROLOL TARTRATE 25 MG PO TABS
ORAL_TABLET | ORAL | Status: AC
  Filled 2022-08-29: qty 4

## 2022-08-29 MED ORDER — ROCURONIUM BROMIDE 100 MG/10ML IV SOLN
INTRAVENOUS | Status: DC | PRN
  Administered 2022-08-29: 20 mg via INTRAVENOUS
  Administered 2022-08-29: 50 mg via INTRAVENOUS
  Administered 2022-08-29 (×2): 20 mg via INTRAVENOUS

## 2022-08-29 MED ORDER — POLYETHYLENE GLYCOL 3350 17 G PO PACK
17.0000 g | PACK | Freq: Every day | ORAL | Status: DC
  Administered 2022-08-29 – 2022-08-30 (×2): 17 g via ORAL
  Filled 2022-08-29 (×3): qty 1

## 2022-08-29 MED ORDER — CELECOXIB 200 MG PO CAPS
ORAL_CAPSULE | ORAL | Status: AC
  Administered 2022-08-29: 200 mg via ORAL
  Filled 2022-08-29: qty 1

## 2022-08-29 MED ORDER — ACETAMINOPHEN 10 MG/ML IV SOLN
INTRAVENOUS | Status: AC
  Filled 2022-08-29: qty 100

## 2022-08-29 MED ORDER — ONDANSETRON 4 MG PO TBDP: 4.0000 mg | ORAL_TABLET | Freq: Four times a day (QID) | ORAL | Status: DC | PRN

## 2022-08-29 MED ORDER — IRRISEPT - 450ML BOTTLE WITH 0.05% CHG IN STERILE WATER, USP 99.95% OPTIME
TOPICAL | Status: DC | PRN
  Administered 2022-08-29: 450 mL

## 2022-08-29 MED ORDER — EPHEDRINE SULFATE (PRESSORS) 50 MG/ML IJ SOLN
INTRAMUSCULAR | Status: DC | PRN
  Administered 2022-08-29 (×2): 5 mg via INTRAVENOUS

## 2022-08-29 MED ORDER — AMLODIPINE BESYLATE 5 MG PO TABS
10.0000 mg | ORAL_TABLET | Freq: Every day | ORAL | Status: DC
  Administered 2022-08-31: 10 mg via ORAL

## 2022-08-29 MED ORDER — BUPIVACAINE HCL (PF) 0.5 % IJ SOLN
INTRAMUSCULAR | Status: AC
  Filled 2022-08-29: qty 30

## 2022-08-29 MED ORDER — CHLORTHALIDONE 25 MG PO TABS
25.0000 mg | ORAL_TABLET | Freq: Every day | ORAL | Status: DC
  Administered 2022-08-30 – 2022-08-31 (×2): 25 mg via ORAL
  Filled 2022-08-29 (×3): qty 1

## 2022-08-29 MED ORDER — FENTANYL CITRATE (PF) 100 MCG/2ML IJ SOLN: 25.0000 ug | INTRAMUSCULAR | Status: DC | PRN

## 2022-08-29 MED ORDER — CELECOXIB 200 MG PO CAPS
200.0000 mg | ORAL_CAPSULE | Freq: Two times a day (BID) | ORAL | Status: DC
  Administered 2022-08-29 – 2022-08-31 (×3): 200 mg via ORAL

## 2022-08-29 MED ORDER — ACETAMINOPHEN 500 MG PO TABS
ORAL_TABLET | ORAL | Status: AC
  Administered 2022-08-29: 1000 mg via ORAL
  Filled 2022-08-29: qty 2

## 2022-08-29 MED ORDER — ONDANSETRON HCL 4 MG/2ML IJ SOLN: 4.0000 mg | Freq: Once | INTRAMUSCULAR | Status: DC | PRN

## 2022-08-29 MED ORDER — PROPOFOL 10 MG/ML IV BOLUS
INTRAVENOUS | Status: DC | PRN
  Administered 2022-08-29: 150 mg via INTRAVENOUS

## 2022-08-29 MED ORDER — HYDROMORPHONE HCL 1 MG/ML IJ SOLN
INTRAMUSCULAR | Status: DC | PRN
  Administered 2022-08-29 (×3): .5 mg via INTRAVENOUS

## 2022-08-29 MED ORDER — FENTANYL CITRATE (PF) 100 MCG/2ML IJ SOLN
INTRAMUSCULAR | Status: AC
  Filled 2022-08-29: qty 2

## 2022-08-29 MED ORDER — ORAL CARE MOUTH RINSE: 15.0000 mL | OROMUCOSAL | Status: DC | PRN

## 2022-08-29 MED ORDER — ENOXAPARIN SODIUM 40 MG/0.4ML IJ SOSY
40.0000 mg | PREFILLED_SYRINGE | INTRAMUSCULAR | Status: DC
  Administered 2022-08-31: 40 mg via SUBCUTANEOUS

## 2022-08-29 MED ORDER — GABAPENTIN 300 MG PO CAPS
300.0000 mg | ORAL_CAPSULE | Freq: Two times a day (BID) | ORAL | Status: DC
  Administered 2022-08-29 – 2022-08-31 (×3): 300 mg via ORAL

## 2022-08-29 MED ORDER — LISINOPRIL-HYDROCHLOROTHIAZIDE 10-12.5 MG PO TABS: 1.0000 | ORAL_TABLET | Freq: Every day | ORAL | Status: DC

## 2022-08-29 MED ORDER — ONDANSETRON HCL 4 MG/2ML IJ SOLN
INTRAMUSCULAR | Status: DC | PRN
  Administered 2022-08-29: 4 mg via INTRAVENOUS

## 2022-08-29 MED ORDER — HYDROCHLOROTHIAZIDE 12.5 MG PO TABS
12.5000 mg | ORAL_TABLET | Freq: Every day | ORAL | Status: DC
  Administered 2022-08-31: 12.5 mg via ORAL
  Filled 2022-08-29 (×3): qty 1

## 2022-08-29 MED ORDER — FENTANYL CITRATE (PF) 100 MCG/2ML IJ SOLN
INTRAMUSCULAR | Status: DC | PRN
  Administered 2022-08-29 (×2): 50 ug via INTRAVENOUS

## 2022-08-29 MED ORDER — HYDROMORPHONE HCL 1 MG/ML IJ SOLN
INTRAMUSCULAR | Status: AC
  Filled 2022-08-29: qty 1

## 2022-08-29 MED ORDER — LIDOCAINE HCL (CARDIAC) PF 100 MG/5ML IV SOSY
PREFILLED_SYRINGE | INTRAVENOUS | Status: DC | PRN
  Administered 2022-08-29: 100 mg via INTRAVENOUS

## 2022-08-29 MED ORDER — 0.9 % SODIUM CHLORIDE (POUR BTL) OPTIME
TOPICAL | Status: DC | PRN
  Administered 2022-08-29: 500 mL

## 2022-08-29 MED ORDER — GABAPENTIN 300 MG PO CAPS
ORAL_CAPSULE | ORAL | Status: AC
  Filled 2022-08-29: qty 1

## 2022-08-29 MED ORDER — PHENYLEPHRINE HCL (PRESSORS) 10 MG/ML IV SOLN
INTRAVENOUS | Status: DC | PRN
  Administered 2022-08-29 (×5): 80 ug via INTRAVENOUS

## 2022-08-29 MED ORDER — DEXAMETHASONE SODIUM PHOSPHATE 10 MG/ML IJ SOLN
INTRAMUSCULAR | Status: DC | PRN
  Administered 2022-08-29: 10 mg via INTRAVENOUS

## 2022-08-29 MED ORDER — CHLORHEXIDINE GLUCONATE 0.12 % MT SOLN
OROMUCOSAL | Status: AC
  Administered 2022-08-29: 15 mL via OROMUCOSAL
  Filled 2022-08-29: qty 15

## 2022-08-29 MED ORDER — SODIUM CHLORIDE (PF) 0.9 % IJ SOLN
INTRAMUSCULAR | Status: AC
  Filled 2022-08-29: qty 50

## 2022-08-29 MED ORDER — DEXMEDETOMIDINE HCL IN NACL 80 MCG/20ML IV SOLN
INTRAVENOUS | Status: DC | PRN
  Administered 2022-08-29 (×5): 4 ug via BUCCAL

## 2022-08-29 MED ORDER — LACTATED RINGERS IV SOLN: INTRAVENOUS | Status: DC | PRN

## 2022-08-29 MED ORDER — METOPROLOL TARTRATE 100 MG PO TABS
100.0000 mg | ORAL_TABLET | Freq: Two times a day (BID) | ORAL | Status: DC
  Administered 2022-08-30 – 2022-08-31 (×3): 100 mg via ORAL
  Filled 2022-08-29 (×2): qty 1

## 2022-08-29 MED ORDER — SEVOFLURANE IN SOLN
RESPIRATORY_TRACT | Status: AC
  Filled 2022-08-29: qty 250

## 2022-08-29 MED ORDER — HYDROCODONE-ACETAMINOPHEN 5-325 MG PO TABS
1.0000 | ORAL_TABLET | ORAL | Status: DC | PRN
  Administered 2022-08-31: 1 via ORAL

## 2022-08-29 MED ORDER — CEFAZOLIN SODIUM-DEXTROSE 2-4 GM/100ML-% IV SOLN
INTRAVENOUS | Status: AC
  Filled 2022-08-29: qty 100

## 2022-08-29 MED ORDER — MORPHINE SULFATE (PF) 4 MG/ML IV SOLN: 4.0000 mg | INTRAVENOUS | Status: DC | PRN

## 2022-08-29 MED ORDER — FAMOTIDINE 20 MG PO TABS
ORAL_TABLET | ORAL | Status: AC
  Administered 2022-08-29: 20 mg via ORAL
  Filled 2022-08-29: qty 1

## 2022-08-29 MED ORDER — PHENYLEPHRINE HCL-NACL 20-0.9 MG/250ML-% IV SOLN
INTRAVENOUS | Status: DC | PRN
  Administered 2022-08-29: 25 ug/min via INTRAVENOUS

## 2022-08-29 MED ORDER — EPINEPHRINE PF 1 MG/ML IJ SOLN
INTRAMUSCULAR | Status: AC
  Filled 2022-08-29: qty 1

## 2022-08-29 MED ORDER — ONDANSETRON HCL 4 MG/2ML IJ SOLN: 4.0000 mg | Freq: Four times a day (QID) | INTRAMUSCULAR | Status: DC | PRN

## 2022-08-29 MED ORDER — GABAPENTIN 300 MG PO CAPS
ORAL_CAPSULE | ORAL | Status: AC
  Administered 2022-08-29: 300 mg via ORAL
  Filled 2022-08-29: qty 1

## 2022-08-29 MED ORDER — LISINOPRIL 5 MG PO TABS
10.0000 mg | ORAL_TABLET | Freq: Every day | ORAL | Status: DC
  Administered 2022-08-31: 10 mg via ORAL

## 2022-08-29 SURGICAL SUPPLY — 50 items
ADH SKN CLS APL DERMABOND .7 (GAUZE/BANDAGES/DRESSINGS) ×1
APL PRP STRL LF DISP 70% ISPRP (MISCELLANEOUS) ×3
BLADE SURG 15 STRL LF DISP TIS (BLADE) ×1 IMPLANT
BLADE SURG 15 STRL SS (BLADE) ×1
CHLORAPREP W/TINT 26 (MISCELLANEOUS) ×1 IMPLANT
DERMABOND ADVANCED .7 DNX12 (GAUZE/BANDAGES/DRESSINGS) ×1 IMPLANT
DRAPE LAPAROTOMY 100X77 ABD (DRAPES) ×1 IMPLANT
ELECT BLADE 6.5 EXT (BLADE) IMPLANT
ELECT REM PT RETURN 9FT ADLT (ELECTROSURGICAL) ×1
ELECTRODE REM PT RTRN 9FT ADLT (ELECTROSURGICAL) ×1 IMPLANT
GAUZE 4X4 16PLY ~~LOC~~+RFID DBL (SPONGE) ×1 IMPLANT
GAUZE SPONGE 4X4 12PLY STRL (GAUZE/BANDAGES/DRESSINGS) IMPLANT
GLOVE BIO SURGEON STRL SZ 6.5 (GLOVE) ×1 IMPLANT
GLOVE BIOGEL PI IND STRL 6.5 (GLOVE) ×1 IMPLANT
GOWN STRL REUS W/ TWL LRG LVL3 (GOWN DISPOSABLE) ×1 IMPLANT
GOWN STRL REUS W/TWL LRG LVL3 (GOWN DISPOSABLE) ×1
JET LAVAGE IRRISEPT WOUND (IRRIGATION / IRRIGATOR) ×1
KIT PREVENA INCISION MGT20CM45 (CANNISTER) IMPLANT
KIT TURNOVER KIT A (KITS) ×1 IMPLANT
LABEL OR SOLS (LABEL) ×1 IMPLANT
LAVAGE JET IRRISEPT WOUND (IRRIGATION / IRRIGATOR) IMPLANT
MANIFOLD NEPTUNE II (INSTRUMENTS) ×1 IMPLANT
MESH PROLENE PML 12X12 (Mesh General) IMPLANT
MESH VENTRALEX ST 1-7/10 CRC S (Mesh General) IMPLANT
MESH VENTRALEX ST 2.5 CRC MED (Mesh General) IMPLANT
MESH VENTRALEX ST 8CM LRG (Mesh General) IMPLANT
NDL HYPO 25X1 1.5 SAFETY (NEEDLE) ×1 IMPLANT
NEEDLE HYPO 25X1 1.5 SAFETY (NEEDLE) ×1 IMPLANT
NS IRRIG 500ML POUR BTL (IV SOLUTION) ×1 IMPLANT
PACK BASIN MINOR ARMC (MISCELLANEOUS) ×1 IMPLANT
SPONGE INTESTINAL PEANUT (DISPOSABLE) IMPLANT
SPONGE VERSALON 4X4 4PLY (MISCELLANEOUS) IMPLANT
STAPLER SKIN PROX 35W (STAPLE) IMPLANT
SUT ETHILON 3-0 FS-10 30 BLK (SUTURE) ×1
SUT MNCRL 4-0 (SUTURE)
SUT MNCRL 4-0 27XMFL (SUTURE)
SUT PDS AB 0 CT1 27 (SUTURE) IMPLANT
SUT PDS AB 2-0 CT1 27 (SUTURE) IMPLANT
SUT PDS PLUS 0 (SUTURE)
SUT PDS PLUS AB 0 CT-2 (SUTURE) ×1 IMPLANT
SUT PROLENE 0 CT 2 (SUTURE) ×2 IMPLANT
SUT SILK 2 0 (SUTURE) ×1
SUT SILK 2-0 18XBRD TIE 12 (SUTURE) IMPLANT
SUT VIC AB 2-0 CT1 36 (SUTURE) IMPLANT
SUT VIC AB 2-0 SH 27 (SUTURE) ×1
SUT VIC AB 2-0 SH 27XBRD (SUTURE) ×1 IMPLANT
SUTURE EHLN 3-0 FS-10 30 BLK (SUTURE) IMPLANT
SUTURE MNCRL 4-0 27XMF (SUTURE) ×1 IMPLANT
SYR 10ML LL (SYRINGE) ×1 IMPLANT
TRAP FLUID SMOKE EVACUATOR (MISCELLANEOUS) ×1 IMPLANT

## 2022-08-29 NOTE — Interval H&P Note (Signed)
History and Physical Interval Note:  08/29/2022 9:48 AM  Lucas Hall  has presented today for surgery, with the diagnosis of K43.9 ventral hernia w/o obstruction or gangrene.  The various methods of treatment have been discussed with the patient and family. After consideration of risks, benefits and other options for treatment, the patient has consented to  Procedure(s) with comments: HERNIA REPAIR VENTRAL ADULT (N/A) - please extend case for 180 minutes as a surgical intervention.  The patient's history has been reviewed, patient examined, no change in status, stable for surgery.  I have reviewed the patient's chart and labs.  Questions were answered to the patient's satisfaction.     Herbert Pun

## 2022-08-29 NOTE — Anesthesia Procedure Notes (Signed)
Procedure Name: Intubation Date/Time: 08/29/2022 10:15 AM  Performed by: Otho Perl, CRNAPre-anesthesia Checklist: Patient identified, Patient being monitored, Timeout performed, Emergency Drugs available and Suction available Patient Re-evaluated:Patient Re-evaluated prior to induction Oxygen Delivery Method: Circle system utilized Preoxygenation: Pre-oxygenation with 100% oxygen Induction Type: IV induction Ventilation: Mask ventilation without difficulty Laryngoscope Size: McGraph and 4 Grade View: Grade I Tube type: Oral Tube size: 7.5 mm Number of attempts: 1 Airway Equipment and Method: Stylet Placement Confirmation: ETT inserted through vocal cords under direct vision, positive ETCO2 and breath sounds checked- equal and bilateral Secured at: 21 cm Tube secured with: Tape Dental Injury: Teeth and Oropharynx as per pre-operative assessment

## 2022-08-29 NOTE — Transfer of Care (Signed)
Immediate Anesthesia Transfer of Care Note  Patient: Lucas Hall  Procedure(s) Performed: HERNIA REPAIR VENTRAL ADULT (Abdomen)  Patient Location: PACU  Anesthesia Type:General  Level of Consciousness: drowsy  Airway & Oxygen Therapy: Patient Spontanous Breathing  Post-op Assessment: Report given to RN  Post vital signs: Reviewed  Last Vitals:  Vitals Value Taken Time  BP 105/79 08/29/22 1400  Temp 13F   Pulse 71 08/29/22 1406  Resp 10 08/29/22 1406  SpO2 98 % 08/29/22 1406  Vitals shown include unvalidated device data.  Last Pain:  Vitals:   08/29/22 0924  TempSrc: Temporal  PainSc: 0-No pain         Complications: No notable events documented.

## 2022-08-29 NOTE — Op Note (Signed)
Preoperative diagnosis: Ventral hernia.  Postoperative diagnosis: Ventral hernia.  Procedure: Ventral hernia repair with mesh placement                      Bilateral Myocutaneous flaps  Anesthesia: GETA  Surgeon: Dr. Windell Moment  Wound Classification: Clean  Indications:Patient is a 62 y.o. male developed a large ventral hernia. This was symptomatic and incarcerated and repair was indicated.   Findings: 1. A 11 cm x 6 cm ventral hernia 2. A 30 cm x 15 cm Prolene mesh used for repair 3. No hollow viscus organ injury identified during procedure 4. Tension free repair achieved 5. Adequate hemostasis  Description of procedure: The patient was brought to the operating room and general anesthesia was induced. A time-out was completed verifying correct patient, procedure, site, positioning, and implant(s) and/or special equipment prior to beginning this procedure. Antibiotics were administered prior to making the incision. The anterior abdominal wall was prepped and draped in the standard sterile fashion. A vertical midline incision incorporating the whole sac was made. The incision was deepened to the fascia. The hernia sac was then identified and dissected free. The peritoneum of the sac was entered and the contents were reduced. The fascia was carefully palpated and no additional defects were identified. Adhesions to the underside of the abdominal wall were lysed and the fascia was assessed. Bilateral myocutaneous flaps were created to release tension to be able to approximate the fascia to the midline. The midline fascia was dissected separating the anterior and posterior fascia creating a posterior component separation to further approximate the fascia to the midline. This was done from xyphoid process to pubis bilaterally. The peritoneum and posterior fascia was approximated at midline and closed with a running 2-0 vicryl suture. A piece of 30 x 15 cm mesh was placed between the posterior  fascia and the rectus muscle. A closed suction drains were placed on the myocutaneous flaps and retrieved through a different incision. The anterior fascia was then closed with a running PDS 0 suture on midline. Excess of skin was removed. Skin closed with staple. A (DME) negative pressure dressing was placed on the wound.  The patient tolerated the procedure well and was brought to the postanesthesia care unit in stable condition.   Specimen: Hernia sac  Complications: None  Estimated Blood Loss: 50 mL

## 2022-08-29 NOTE — Anesthesia Preprocedure Evaluation (Signed)
Anesthesia Evaluation  Patient identified by MRN, date of birth, ID band Patient awake    Reviewed: Allergy & Precautions, H&P , NPO status , Patient's Chart, lab work & pertinent test results, reviewed documented beta blocker date and time   History of Anesthesia Complications Negative for: history of anesthetic complications  Airway Mallampati: III  TM Distance: >3 FB Neck ROM: full    Dental  (+) Dental Advidsory Given, Edentulous Upper, Poor Dentition, Missing   Pulmonary neg shortness of breath, sleep apnea , neg COPD, neg recent URI   Pulmonary exam normal breath sounds clear to auscultation       Cardiovascular Exercise Tolerance: Good hypertension, (-) angina (-) Past MI and (-) Cardiac Stents Normal cardiovascular exam(-) dysrhythmias (-) Valvular Problems/Murmurs Rhythm:regular Rate:Normal     Neuro/Psych negative neurological ROS  negative psych ROS   GI/Hepatic negative GI ROS, Neg liver ROS,,,  Endo/Other  diabetes    Renal/GU negative Renal ROS  negative genitourinary   Musculoskeletal   Abdominal   Peds  Hematology negative hematology ROS (+)   Anesthesia Other Findings Past Medical History: No date: Diabetes mellitus without complication (HCC) No date: Hyperlipidemia No date: Hypertension No date: Sleep apnea   Reproductive/Obstetrics negative OB ROS                             Anesthesia Physical Anesthesia Plan  ASA: 2  Anesthesia Plan: General   Post-op Pain Management:    Induction: Intravenous  PONV Risk Score and Plan: Ondansetron, Dexamethasone, Midazolam and Treatment may vary due to age or medical condition  Airway Management Planned: Oral ETT  Additional Equipment:   Intra-op Plan:   Post-operative Plan: Extubation in OR  Informed Consent: I have reviewed the patients History and Physical, chart, labs and discussed the procedure including the  risks, benefits and alternatives for the proposed anesthesia with the patient or authorized representative who has indicated his/her understanding and acceptance.     Dental Advisory Given  Plan Discussed with: Anesthesiologist, CRNA and Surgeon  Anesthesia Plan Comments:         Anesthesia Quick Evaluation

## 2022-08-30 ENCOUNTER — Encounter: Payer: Self-pay | Admitting: General Surgery

## 2022-08-30 LAB — GLUCOSE, CAPILLARY
Glucose-Capillary: 101 mg/dL — ABNORMAL HIGH (ref 70–99)
Glucose-Capillary: 134 mg/dL — ABNORMAL HIGH (ref 70–99)
Glucose-Capillary: 89 mg/dL (ref 70–99)
Glucose-Capillary: 114 mg/dL — ABNORMAL HIGH (ref 70–99)

## 2022-08-30 LAB — CBC
HCT: 35.6 % — ABNORMAL LOW (ref 39.0–52.0)
Hemoglobin: 12.2 g/dL — ABNORMAL LOW (ref 13.0–17.0)
MCH: 30.7 pg (ref 26.0–34.0)
MCHC: 34.3 g/dL (ref 30.0–36.0)
MCV: 89.7 fL (ref 80.0–100.0)
Platelets: 141 10*3/uL — ABNORMAL LOW (ref 150–400)
RBC: 3.97 MIL/uL — ABNORMAL LOW (ref 4.22–5.81)
RDW: 12.6 % (ref 11.5–15.5)
WBC: 9.3 10*3/uL (ref 4.0–10.5)
nRBC: 0 % (ref 0.0–0.2)

## 2022-08-30 LAB — BASIC METABOLIC PANEL
Anion gap: 8 (ref 5–15)
BUN: 53 mg/dL — ABNORMAL HIGH (ref 8–23)
CO2: 22 mmol/L (ref 22–32)
Calcium: 8.2 mg/dL — ABNORMAL LOW (ref 8.9–10.3)
Chloride: 102 mmol/L (ref 98–111)
Creatinine, Ser: 3 mg/dL — ABNORMAL HIGH (ref 0.61–1.24)
GFR, Estimated: 23 mL/min — ABNORMAL LOW (ref >60)
Glucose, Bld: 123 mg/dL — ABNORMAL HIGH (ref 70–99)
Potassium: 3.8 mmol/L (ref 3.5–5.1)
Sodium: 132 mmol/L — ABNORMAL LOW (ref 135–145)

## 2022-08-30 LAB — SURGICAL PATHOLOGY

## 2022-08-30 MED ORDER — GABAPENTIN 300 MG PO CAPS
ORAL_CAPSULE | ORAL | Status: AC
  Filled 2022-08-30: qty 1

## 2022-08-30 MED ORDER — CELECOXIB 200 MG PO CAPS
ORAL_CAPSULE | ORAL | Status: AC
  Administered 2022-08-30: 200 mg via ORAL
  Filled 2022-08-30: qty 1

## 2022-08-30 MED ORDER — AMLODIPINE BESYLATE 5 MG PO TABS
ORAL_TABLET | ORAL | Status: AC
  Filled 2022-08-30: qty 2

## 2022-08-30 MED ORDER — ENOXAPARIN SODIUM 40 MG/0.4ML IJ SOSY
PREFILLED_SYRINGE | INTRAMUSCULAR | Status: AC
  Administered 2022-08-30: 40 mg via SUBCUTANEOUS
  Filled 2022-08-30: qty 0.4

## 2022-08-30 MED ORDER — LISINOPRIL 5 MG PO TABS
ORAL_TABLET | ORAL | Status: AC
  Administered 2022-08-30: 10 mg via ORAL
  Filled 2022-08-30: qty 2

## 2022-08-30 MED ORDER — METOPROLOL TARTRATE 25 MG PO TABS
ORAL_TABLET | ORAL | Status: AC
  Filled 2022-08-30: qty 4

## 2022-08-30 MED ORDER — GABAPENTIN 300 MG PO CAPS
ORAL_CAPSULE | ORAL | Status: AC
  Administered 2022-08-30: 300 mg via ORAL
  Filled 2022-08-30: qty 1

## 2022-08-30 MED ORDER — CELECOXIB 200 MG PO CAPS
ORAL_CAPSULE | ORAL | Status: AC
  Filled 2022-08-30: qty 1

## 2022-08-30 NOTE — Progress Notes (Signed)
Patient ID: Lucas Hall, male   DOB: January 15, 1961, 62 y.o.   MRN: [id number]     [place] [facility] Day(s): 1.   Interval History: Patient seen and examined, no acute events or new complaints overnight. Patient reports feeling well this morning.  He endorses that the pain is under control.  He still has not ambulated.  He still has not passed any gas or bowel movement.  Vital signs in last 24 hours: [min-max] current  Temp:  [97 F (36.1 C)-98.3 F (36.8 C)] 98.1 F (36.7 C) (03/14 0755) Pulse Rate:  [66-78] 70 (03/14 0755) Resp:  [12-16] 14 (03/14 0755) BP: (87-113)/(66-86) 113/86 (03/14 0755) SpO2:  [92 %-100 %] 97 % (03/14 0755)     Height: '5\' 11"'$  (180.3 cm) Weight: 117 kg BMI (Calculated): 36   Physical Exam:  Constitutional: alert, cooperative and no distress  Respiratory: breathing non-labored at rest  Cardiovascular: regular rate and sinus rhythm  Gastrointestinal: soft, non-tender, and non-distended.  Wound covered with negative pressure dressing.  Labs:     Latest Ref Rng & Units 08/30/2022    7:25 AM 08/22/2022   10:12 AM  CBC  WBC 4.0 - 10.5 K/uL 9.3  3.8   Hemoglobin 13.0 - 17.0 g/dL 12.2  13.4   Hematocrit 39.0 - 52.0 % 35.6  39.5   Platelets 150 - 400 K/uL 141  161       Latest Ref Rng & Units 08/30/2022    7:25 AM 08/22/2022   10:12 AM  CMP  Glucose 70 - 99 mg/dL 123  104   BUN 8 - 23 mg/dL 53  22   Creatinine 0.61 - 1.24 mg/dL 3.00  1.16   Sodium 135 - 145 mmol/L 132  139   Potassium 3.5 - 5.1 mmol/L 3.8  3.5   Chloride 98 - 111 mmol/L 102  104   CO2 22 - 32 mmol/L 22  26   Calcium 8.9 - 10.3 mg/dL 8.2  9.3     Imaging studies: No new pertinent imaging studies   Assessment/Plan:  62 y.o. male with incarcerated ventral hernia 1 Day Post-Op s/p ventral hernia repair with bilateral myocutaneous flaps.  -At this point with stable vital signs, no fever, no tachycardia. -Pain seems to be under control today.  Will continue with current pain  medication -I encouraged the patient to ambulate today.  Will also assess diet toleration. -Still not having any bowel movement.  Will continue with MiraLAX. -Will continue with DVT prophylaxis  Arnold Long, MD

## 2022-08-30 NOTE — Anesthesia Postprocedure Evaluation (Signed)
Anesthesia Post Note  Patient: Lucas Hall  Procedure(s) Performed: HERNIA REPAIR VENTRAL ADULT (Abdomen)  Patient location during evaluation: PACU Anesthesia Type: General Level of consciousness: awake and alert Pain management: pain level controlled Vital Signs Assessment: post-procedure vital signs reviewed and stable Respiratory status: spontaneous breathing, nonlabored ventilation, respiratory function stable and patient connected to nasal cannula oxygen Cardiovascular status: blood pressure returned to baseline and stable Postop Assessment: no apparent nausea or vomiting Anesthetic complications: no   No notable events documented.   Last Vitals:  Vitals:   08/30/22 0400 08/30/22 0755  BP: 107/66 113/86  Pulse: 72 70  Resp: 16 14  Temp: 36.8 C 36.7 C  SpO2: 95% 97%    Last Pain:  Vitals:   08/30/22 0755  TempSrc: Temporal  PainSc:                  [name]

## 2022-08-30 NOTE — Progress Notes (Signed)
Notified MD of patient's BP. Norvasc and HCTZ held per MD order.

## 2022-08-31 LAB — CBC
HCT: 34.8 % — ABNORMAL LOW (ref 39.0–52.0)
Hemoglobin: 12 g/dL — ABNORMAL LOW (ref 13.0–17.0)
MCH: 30.5 pg (ref 26.0–34.0)
MCHC: 34.5 g/dL (ref 30.0–36.0)
MCV: 88.5 fL (ref 80.0–100.0)
Platelets: 146 10*3/uL — ABNORMAL LOW (ref 150–400)
RBC: 3.93 MIL/uL — ABNORMAL LOW (ref 4.22–5.81)
RDW: 12.6 % (ref 11.5–15.5)
WBC: 6.5 10*3/uL (ref 4.0–10.5)
nRBC: 0 % (ref 0.0–0.2)

## 2022-08-31 LAB — BASIC METABOLIC PANEL
Anion gap: 8 (ref 5–15)
BUN: 51 mg/dL — ABNORMAL HIGH (ref 8–23)
CO2: 26 mmol/L (ref 22–32)
Calcium: 8.2 mg/dL — ABNORMAL LOW (ref 8.9–10.3)
Chloride: 104 mmol/L (ref 98–111)
Creatinine, Ser: 1.98 mg/dL — ABNORMAL HIGH (ref 0.61–1.24)
GFR, Estimated: 37 mL/min — ABNORMAL LOW (ref >60)
Glucose, Bld: 94 mg/dL (ref 70–99)
Potassium: 3.6 mmol/L (ref 3.5–5.1)
Sodium: 138 mmol/L (ref 135–145)

## 2022-08-31 LAB — GLUCOSE, CAPILLARY: Glucose-Capillary: 99 mg/dL (ref 70–99)

## 2022-08-31 MED ORDER — AMLODIPINE BESYLATE 5 MG PO TABS
ORAL_TABLET | ORAL | Status: AC
  Filled 2022-08-31: qty 2

## 2022-08-31 MED ORDER — HYDROCODONE-ACETAMINOPHEN 5-325 MG PO TABS
ORAL_TABLET | ORAL | Status: AC
  Filled 2022-08-31: qty 1

## 2022-08-31 MED ORDER — CELECOXIB 200 MG PO CAPS
ORAL_CAPSULE | ORAL | Status: AC
  Filled 2022-08-31: qty 1

## 2022-08-31 MED ORDER — GABAPENTIN 300 MG PO CAPS
ORAL_CAPSULE | ORAL | Status: AC
  Filled 2022-08-31: qty 1

## 2022-08-31 MED ORDER — LISINOPRIL 5 MG PO TABS
ORAL_TABLET | ORAL | Status: AC
  Filled 2022-08-31: qty 2

## 2022-08-31 MED ORDER — OXYCODONE-ACETAMINOPHEN 5-325 MG PO TABS: 1.0000 | ORAL_TABLET | ORAL | 0 refills | Status: AC | PRN

## 2022-08-31 MED ORDER — ENOXAPARIN SODIUM 40 MG/0.4ML IJ SOSY
PREFILLED_SYRINGE | INTRAMUSCULAR | Status: AC
  Filled 2022-08-31: qty 0.4

## 2022-08-31 NOTE — Discharge Instructions (Signed)
  Diet: Resume home heart healthy regular diet.   Activity: No heavy lifting >20 pounds (children, pets, laundry, garbage) or strenuous activity until follow-up, but light activity and walking are encouraged. Do not drive or drink alcohol if taking narcotic pain medications.  Wound care: Remove dressing when the battery goes off. Once dressing removed, may shower with soapy water and pat dry (do not rub incisions), but no baths or submerging incision underwater until follow-up.   Medications: Resume all home medications. For mild to moderate pain: acetaminophen (Tylenol) or ibuprofen (if no kidney disease). Combining Tylenol with alcohol can substantially increase your risk of causing liver disease. Narcotic pain medications, if prescribed, can be used for severe pain, though may cause nausea, constipation, and drowsiness. If you do not need the narcotic pain medication, you do not need to fill the prescription.  Call office [phone]) at any time if any questions, worsening pain, fevers/chills, bleeding, drainage from incision site, or other concerns.

## 2022-08-31 NOTE — Discharge Summary (Signed)
  Patient ID: Lucas Hall MRN: [id number] DOB/AGE: Aug 07, 1960 62 y.o.  Admit date: 08/29/2022 Discharge date: 08/31/2022   Discharge Diagnoses:  Principal Problem:   Incarcerated ventral hernia   Procedures: Ventral hernia repair with mesh   Hospital Course: Patient with large ventral hernia admitted for ventral hernia repair. He has been recovering adequately. Pain controlled. Tolerating diet and having bowel movement. Will discharge patient with negative pressure dressing and remove it on Monday. I discussed with family of how to removed negative pressure dressing if it stop working before Monday. Oriented about how to take care of drain.   Physical Exam Vitals reviewed.  HENT:     Head: Normocephalic.  Cardiovascular:     Rate and Rhythm: Normal rate and regular rhythm.     Pulses: Normal pulses.  Pulmonary:     Effort: Pulmonary effort is normal.     Breath sounds: Normal breath sounds.  Abdominal:     General: Abdomen is flat. Bowel sounds are normal. There is no distension.     Tenderness: There is no abdominal tenderness.  Musculoskeletal:        General: Normal range of motion.     Cervical back: Normal range of motion.  Skin:    General: Skin is warm.     Capillary Refill: Capillary refill takes less than 2 seconds.  Neurological:     Mental Status: He is alert and oriented to person, place, and time.      Consults: None  Disposition: Discharge disposition: 01-Home or Self Care      Discharge Instructions     Diet - low sodium heart healthy   Complete by: As directed    Increase activity slowly   Complete by: As directed       Allergies as of 08/31/2022   No Known Allergies      Medication List     TAKE these medications    amLODipine 10 MG tablet Commonly known as: NORVASC Take 10 mg by mouth daily.   atorvastatin 10 MG tablet Commonly known as: LIPITOR Take 10 mg by mouth daily.   chlorthalidone 25 MG tablet Commonly known as:  HYGROTON Take 25 mg by mouth daily.   lisinopril 40 MG tablet Commonly known as: ZESTRIL Take 40 mg by mouth daily.   lisinopril-hydrochlorothiazide 10-12.5 MG tablet Commonly known as: ZESTORETIC Take 1 tablet by mouth daily.   metFORMIN 500 MG tablet Commonly known as: GLUCOPHAGE Take 1,000 mg by mouth 2 (two) times daily with a meal. Two tablets twice daily   metoprolol tartrate 100 MG tablet Commonly known as: LOPRESSOR Take 100 mg by mouth 2 (two) times daily.   oxyCODONE-acetaminophen 5-325 MG tablet Commonly known as: Percocet Take 1 tablet by mouth every 4 (four) hours as needed for severe pain.   pioglitazone 15 MG tablet Commonly known as: ACTOS Take 15 mg by mouth daily.   Semaglutide (1 MG/DOSE) 4 MG/3ML Sopn Inject 1 mg into the skin every 7 (seven) days.        Follow-up Information     Herbert Pun, MD Follow up on 09/03/2022.   Specialty: General Surgery Why: For wound and drain chek Contact information: [place] [address] [phone]                 This discharge encounter was for more 35 minutes, most of the time counseling patient and coordinating plan of care.
